# Patient Record
Sex: Female | Born: 1970 | Race: Black or African American | Hispanic: No | Smoking: Former smoker
Health system: Southern US, Community
[De-identification: ages and names within clinical notes are randomized; demographics above are authoritative.]

## PROBLEM LIST (undated history)

## (undated) ENCOUNTER — Ambulatory Visit: Discharge status: Absent without leave | Payer: BC Managed Care – PPO

## (undated) DIAGNOSIS — C73 Malignant neoplasm of thyroid gland: Secondary | ICD-10-CM

## (undated) DIAGNOSIS — J189 Pneumonia, unspecified organism: Secondary | ICD-10-CM

## (undated) DIAGNOSIS — I10 Essential (primary) hypertension: Secondary | ICD-10-CM

---

## 1992-03-10 DIAGNOSIS — C73 Malignant neoplasm of thyroid gland: Secondary | ICD-10-CM | POA: Insufficient documentation

## 1997-10-27 ENCOUNTER — Other Ambulatory Visit: Admission: RE | Admit: 1997-10-27 | Discharge: 1997-10-27 | Payer: Self-pay

## 2000-08-04 ENCOUNTER — Ambulatory Visit (HOSPITAL_COMMUNITY): Admission: RE | Admit: 2000-08-04 | Discharge: 2000-08-04 | Payer: Self-pay | Admitting: Oncology

## 2000-08-07 ENCOUNTER — Ambulatory Visit (HOSPITAL_COMMUNITY): Admission: RE | Admit: 2000-08-07 | Discharge: 2000-08-07 | Payer: Self-pay | Admitting: Oncology

## 2000-08-07 ENCOUNTER — Encounter: Payer: Self-pay | Admitting: Oncology

## 2000-08-22 ENCOUNTER — Encounter: Payer: Self-pay | Admitting: Oncology

## 2000-08-22 ENCOUNTER — Ambulatory Visit (HOSPITAL_COMMUNITY): Admission: RE | Admit: 2000-08-22 | Discharge: 2000-08-22 | Payer: Self-pay | Admitting: Oncology

## 2002-10-21 ENCOUNTER — Encounter: Payer: Self-pay | Admitting: General Surgery

## 2002-10-21 ENCOUNTER — Encounter: Admission: RE | Admit: 2002-10-21 | Discharge: 2002-10-21 | Payer: Self-pay | Admitting: General Surgery

## 2002-10-22 ENCOUNTER — Ambulatory Visit (HOSPITAL_BASED_OUTPATIENT_CLINIC_OR_DEPARTMENT_OTHER): Admission: RE | Admit: 2002-10-22 | Discharge: 2002-10-22 | Payer: Self-pay | Admitting: General Surgery

## 2013-02-17 ENCOUNTER — Other Ambulatory Visit: Payer: Self-pay | Admitting: Oncology

## 2013-02-17 DIAGNOSIS — C73 Malignant neoplasm of thyroid gland: Secondary | ICD-10-CM

## 2013-03-29 ENCOUNTER — Encounter (HOSPITAL_COMMUNITY): Payer: Self-pay

## 2013-03-30 ENCOUNTER — Encounter (HOSPITAL_COMMUNITY): Payer: Self-pay

## 2013-03-31 ENCOUNTER — Encounter (HOSPITAL_COMMUNITY): Payer: Self-pay

## 2013-04-02 ENCOUNTER — Encounter (HOSPITAL_COMMUNITY): Payer: Self-pay

## 2013-04-12 ENCOUNTER — Encounter (HOSPITAL_COMMUNITY)
Admission: RE | Admit: 2013-04-12 | Discharge: 2013-04-12 | Disposition: A | Discharge status: Home / Self Care | Payer: Medicaid Other | Source: Ambulatory Visit | Attending: Oncology | Admitting: Oncology

## 2013-04-13 ENCOUNTER — Encounter (HOSPITAL_COMMUNITY): Payer: Medicaid Other

## 2013-04-14 ENCOUNTER — Encounter (HOSPITAL_COMMUNITY): Payer: Medicaid Other

## 2013-04-16 ENCOUNTER — Encounter (HOSPITAL_COMMUNITY): Payer: Medicaid Other

## 2014-08-27 ENCOUNTER — Emergency Department (HOSPITAL_COMMUNITY)
Admission: EM | Admit: 2014-08-27 | Discharge: 2014-08-27 | Disposition: A | Discharge status: Home / Self Care | Payer: Medicaid Other | Attending: Emergency Medicine | Admitting: Emergency Medicine

## 2014-08-27 ENCOUNTER — Encounter (HOSPITAL_COMMUNITY): Payer: Self-pay | Admitting: *Deleted

## 2014-08-27 ENCOUNTER — Emergency Department (HOSPITAL_COMMUNITY): Payer: Medicaid Other

## 2014-08-27 DIAGNOSIS — J159 Unspecified bacterial pneumonia: Secondary | ICD-10-CM | POA: Insufficient documentation

## 2014-08-27 DIAGNOSIS — Z7951 Long term (current) use of inhaled steroids: Secondary | ICD-10-CM | POA: Insufficient documentation

## 2014-08-27 DIAGNOSIS — Z79899 Other long term (current) drug therapy: Secondary | ICD-10-CM | POA: Diagnosis not present

## 2014-08-27 DIAGNOSIS — J189 Pneumonia, unspecified organism: Secondary | ICD-10-CM

## 2014-08-27 DIAGNOSIS — Z720 Tobacco use: Secondary | ICD-10-CM | POA: Insufficient documentation

## 2014-08-27 DIAGNOSIS — Z85850 Personal history of malignant neoplasm of thyroid: Secondary | ICD-10-CM | POA: Insufficient documentation

## 2014-08-27 DIAGNOSIS — R05 Cough: Secondary | ICD-10-CM | POA: Diagnosis present

## 2014-08-27 HISTORY — DX: Pneumonia, unspecified organism: J18.9

## 2014-08-27 HISTORY — DX: Malignant neoplasm of thyroid gland: C73

## 2014-08-27 LAB — CBC
HCT: 39 % (ref 36.0–46.0)
Hemoglobin: 13.2 g/dL (ref 12.0–15.0)
MCH: 30.7 pg (ref 26.0–34.0)
MCHC: 33.8 g/dL (ref 30.0–36.0)
MCV: 90.7 fL (ref 78.0–100.0)
Platelets: 315 10*3/uL (ref 150–400)
RBC: 4.3 MIL/uL (ref 3.87–5.11)
RDW: 14.1 % (ref 11.5–15.5)
WBC: 19.6 10*3/uL — ABNORMAL HIGH (ref 4.0–10.5)

## 2014-08-27 LAB — BASIC METABOLIC PANEL
Anion gap: 12 (ref 5–15)
BUN: 11 mg/dL (ref 6–23)
CO2: 25 mmol/L (ref 19–32)
Calcium: 9.3 mg/dL (ref 8.4–10.5)
Chloride: 102 mmol/L (ref 96–112)
Creatinine, Ser: 0.92 mg/dL (ref 0.50–1.10)
GFR calc Af Amer: 87 mL/min — ABNORMAL LOW (ref >90)
GFR calc non Af Amer: 75 mL/min — ABNORMAL LOW (ref >90)
Glucose, Bld: 117 mg/dL — ABNORMAL HIGH (ref 70–99)
Potassium: 3.5 mmol/L (ref 3.5–5.1)
Sodium: 139 mmol/L (ref 135–145)

## 2014-08-27 LAB — I-STAT CG4 LACTIC ACID, ED: Lactic Acid, Venous: 1.56 mmol/L (ref 0.5–2.0)

## 2014-08-27 MED ORDER — DEXTROSE 5 % IV SOLN
1.0000 g | Freq: Once | INTRAVENOUS | Status: AC
  Administered 2014-08-27: 1 g via INTRAVENOUS
  Filled 2014-08-27: qty 10

## 2014-08-27 MED ORDER — AZITHROMYCIN 250 MG PO TABS
250.0000 mg | ORAL_TABLET | Freq: Every day | ORAL | Status: DC
Start: 2014-08-27 — End: 2021-07-09

## 2014-08-27 MED ORDER — AZITHROMYCIN 250 MG PO TABS
500.0000 mg | ORAL_TABLET | Freq: Once | ORAL | Status: AC
  Administered 2014-08-27: 500 mg via ORAL
  Filled 2014-08-27: qty 2

## 2014-08-27 MED ORDER — ALBUTEROL SULFATE HFA 108 (90 BASE) MCG/ACT IN AERS
2.0000 | INHALATION_SPRAY | RESPIRATORY_TRACT | Status: DC | PRN
  Administered 2014-08-27: 2 via RESPIRATORY_TRACT
  Filled 2014-08-27: qty 6.7

## 2014-08-27 MED ORDER — ACETAMINOPHEN 325 MG PO TABS
650.0000 mg | ORAL_TABLET | Freq: Once | ORAL | Status: AC
  Administered 2014-08-27: 650 mg via ORAL
  Filled 2014-08-27: qty 2

## 2014-08-27 MED ORDER — ALBUTEROL SULFATE (2.5 MG/3ML) 0.083% IN NEBU
5.0000 mg | INHALATION_SOLUTION | Freq: Once | RESPIRATORY_TRACT | Status: AC
  Administered 2014-08-27: 5 mg via RESPIRATORY_TRACT
  Filled 2014-08-27: qty 6

## 2014-08-27 MED ORDER — SODIUM CHLORIDE 0.9 % IV BOLUS (SEPSIS)
2000.0000 mL | Freq: Once | INTRAVENOUS | Status: AC
  Administered 2014-08-27: 2000 mL via INTRAVENOUS

## 2014-08-27 MED ORDER — AEROCHAMBER PLUS W/MASK MISC
1.0000 | Freq: Once | Status: AC
  Administered 2014-08-27: 1
  Filled 2014-08-27: qty 1

## 2014-08-27 NOTE — ED Notes (Signed)
Dr. Carney Bern at bedside

## 2014-08-27 NOTE — ED Notes (Signed)
Pt states that she has been diagnosed with pneumonia and bronchitis. Pt states that she is not improving over the last month. Pt reports taking her antibiotics and tylenol today. Pt reports chills. No fever at triage. Pt now reports sinus congestion and headache.

## 2014-08-27 NOTE — ED Provider Notes (Signed)
CSN: [id number]     Arrival date & time 08/27/14  1557 History   First MD Initiated Contact with Patient 08/27/14 1801     Chief Complaint  Patient presents with  . Pneumonia     (Consider location/radiation/quality/duration/timing/severity/associated sxs/prior Treatment) HPI Complete of cough productive of yellow sputum occluded by shortness of breath onset 3 weeks ago. Maximum temperature 102 3 weeks ago. She was started on Levaquin 3 weeks ago she stopped after 5 days due to feeling of throat closing and losing her vision. She subsequently was started on steroids which she completed 10 days ago and started on a steroid inhaler and loratadine 3 days ago at an urgent care center. She continues to cough. She denies nausea or vomiting. Other associated symptoms include nasal congestion and congestion in her chest Past Medical History  Diagnosis Date  . Pneumonia   . Thyroid cancer    hypertension. Thyroid cancer age 38  History reviewed. No pertinent past surgical history. No family history on file. surgical history thyroidectomy  History  Substance Use Topics  . Smoking status: Current Every Day Smoker -- 0.50 packs/day    Types: Cigarettes  . Smokeless tobacco: Not on file  . Alcohol Use: No   no illicit drug use  OB History    No data available     Review of Systems  Constitutional: Negative.   HENT: Positive for congestion.   Respiratory: Positive for cough and shortness of breath.   Cardiovascular: Negative.   Gastrointestinal: Negative.   Musculoskeletal: Negative.   Skin: Negative.   Neurological: Negative.   Psychiatric/Behavioral: Negative.   All other systems reviewed and are negative.     Allergies  Levaquin  Home Medications   Prior to Admission medications   Medication Sig Start Date End Date Taking? Authorizing Provider  diphenhydrAMINE (BENADRYL) 25 MG tablet Take 25 mg by mouth at bedtime as needed.   Yes Historical Provider, MD  fluticasone  (FLONASE) 50 MCG/ACT nasal spray Place 2 sprays into both nostrils daily.   Yes Historical Provider, MD  Fluticasone Furoate-Vilanterol 100-25 MCG/INH AEPB Inhale 1 puff into the lungs daily.   Yes Historical Provider, MD  levothyroxine (SYNTHROID, LEVOTHROID) 125 MCG tablet Take 125 mcg by mouth daily before breakfast.   Yes Historical Provider, MD  loratadine (CLARITIN) 10 MG tablet Take 10 mg by mouth daily.   Yes Historical Provider, MD  triamterene-hydrochlorothiazide (MAXZIDE-25) 37.5-25 MG per tablet Take 1 tablet by mouth daily.   Yes Historical Provider, MD  varenicline (CHANTIX PAK) 0.5 MG X 11 & 1 MG X 42 tablet Take 0.5 mg by mouth See admin instructions. Started 08/27/2014:  Take one 0.5 mg tablet by mouth once daily for 3 days, then increase to one 0.5 mg tablet twice daily for 4 days, then increase to one 1 mg tablet twice daily.   Yes Historical Provider, MD   BP 140/85 mmHg  Pulse 100  Temp(Src) 99.5 F (37.5 C) (Oral)  Resp 24  SpO2 96% Physical Exam  Constitutional: She appears well-developed and well-nourished. No distress.  HENT:  Head: Normocephalic and atraumatic.  Eyes: Conjunctivae are normal. Pupils are equal, round, and reactive to light.  Neck: Neck supple. No tracheal deviation present. No thyromegaly present.  Cardiovascular: Normal rate and regular rhythm.   No murmur heard. Pulmonary/Chest: Effort normal.  Diffuse rhonchi, coughing occasionally  Abdominal: Soft. Bowel sounds are normal. She exhibits no distension. There is no tenderness.  Musculoskeletal: Normal range of motion. She  exhibits no edema or tenderness.  Neurological: She is alert. Coordination normal.  Skin: Skin is warm and dry. No rash noted.  Psychiatric: She has a normal mood and affect.  Nursing note and vitals reviewed.   ED Course  Procedures (including critical care time) Labs Review Labs Reviewed  CBC - Abnormal; Notable for the following:    WBC 19.6 (*)    All other  components within normal limits  BASIC METABOLIC PANEL - Abnormal; Notable for the following:    Glucose, Bld 117 (*)    GFR calc non Af Amer 75 (*)    GFR calc Af Amer 87 (*)    All other components within normal limits    Imaging Review Dg Chest 2 View  08/27/2014   CLINICAL DATA:  Shortness of breath, cough, fever  EXAM: CHEST  2 VIEW  COMPARISON:  08/13/2014  FINDINGS: Mild patchy left lower lobe opacity, suspicious for pneumonia. No pleural effusion or pneumothorax.  The heart is normal in size.  Visualized osseous structures are within normal limits.  IMPRESSION: Patchy left lower lobe opacity, suspicious for pneumonia.   Electronically Signed   By: Julian Hy M.D.   On: 08/27/2014 17:26     EKG Interpretation None     Chest x-ray viewed by me  8:50 PM patient feels much improved after treatment with albuterol nebulized treatment, intravenous fluids and antibiotics.  Results for orders placed or performed during the hospital encounter of 08/27/14  CBC  Result Value Ref Range   WBC 19.6 (H) 4.0 - 10.5 K/uL   RBC 4.30 3.87 - 5.11 MIL/uL   Hemoglobin 13.2 12.0 - 15.0 g/dL   HCT 39.0 36.0 - 46.0 %   MCV 90.7 78.0 - 100.0 fL   MCH 30.7 26.0 - 34.0 pg   MCHC 33.8 30.0 - 36.0 g/dL   RDW 14.1 11.5 - 15.5 %   Platelets 315 150 - 400 K/uL  Basic metabolic panel  Result Value Ref Range   Sodium 139 135 - 145 mmol/L   Potassium 3.5 3.5 - 5.1 mmol/L   Chloride 102 96 - 112 mmol/L   CO2 25 19 - 32 mmol/L   Glucose, Bld 117 (H) 70 - 99 mg/dL   BUN 11 6 - 23 mg/dL   Creatinine, Ser 0.92 0.50 - 1.10 mg/dL   Calcium 9.3 8.4 - 10.5 mg/dL   GFR calc non Af Amer 75 (L) >90 mL/min   GFR calc Af Amer 87 (L) >90 mL/min   Anion gap 12 5 - 15  I-Stat CG4 Lactic Acid, ED  Result Value Ref Range   Lactic Acid, Venous 1.56 0.5 - 2.0 mmol/L   Dg Chest 2 View  08/27/2014   CLINICAL DATA:  Shortness of breath, cough, fever  EXAM: CHEST  2 VIEW  COMPARISON:  08/13/2014  FINDINGS: Mild  patchy left lower lobe opacity, suspicious for pneumonia. No pleural effusion or pneumothorax.  The heart is normal in size.  Visualized osseous structures are within normal limits.  IMPRESSION: Patchy left lower lobe opacity, suspicious for pneumonia.   Electronically Signed   By: Julian Hy M.D.   On: 08/27/2014 17:26    MDM  PSI/port score 34 without history of neoplastic disease. She is cancer free since age 33. Considering history of thyroid cancer PSI port score equals 64. Outpatient treatment reasonable. Patient feels and looks well on discharge. Plan discontinueBReo inhaler. Albuterol HFA with spacer to go. Prescription Zithromax. Follow-up with primary  care physician at [facility] family [facility] if not improving by next week. Diagnosis community acquired pneumonia Final diagnoses:  None        Orlie Dakin, MD 08/27/14 2103

## 2014-08-27 NOTE — ED Notes (Signed)
Pt ambulated to restroom. 

## 2014-08-27 NOTE — Discharge Instructions (Signed)
Pneumonia Use your inhaler 2 puffs every 4 hours as needed for cough or shortness of breath. Discontinue BREO inhaler. Call your primary care physician at [facility] if not improving by next week. Return if your condition worsens for any reason. Pneumonia is an infection of the lungs.  CAUSES Pneumonia may be caused by bacteria or a virus. Usually, these infections are caused by breathing infectious particles into the lungs (respiratory tract). SIGNS AND SYMPTOMS   Cough.  Fever.  Chest pain.  Increased rate of breathing.  Wheezing.  Mucus production. DIAGNOSIS  If you have the common symptoms of pneumonia, your health care provider will typically confirm the diagnosis with a chest X-ray. The X-ray will show an abnormality in the lung (pulmonary infiltrate) if you have pneumonia. Other tests of your blood, urine, or sputum may be done to find the specific cause of your pneumonia. Your health care provider may also do tests (blood gases or pulse oximetry) to see how well your lungs are working. TREATMENT  Some forms of pneumonia may be spread to other people when you cough or sneeze. You may be asked to wear a mask before and during your exam. Pneumonia that is caused by bacteria is treated with antibiotic medicine. Pneumonia that is caused by the influenza virus may be treated with an antiviral medicine. Most other viral infections must run their course. These infections will not respond to antibiotics.  HOME CARE INSTRUCTIONS   Cough suppressants may be used if you are losing too much rest. However, coughing protects you by clearing your lungs. You should avoid using cough suppressants if you can.  Your health care provider may have prescribed medicine if he or she thinks your pneumonia is caused by bacteria or influenza. Finish your medicine even if you start to feel better.  Your health care provider may also prescribe an expectorant. This loosens the mucus to be coughed  up.  Take medicines only as directed by your health care provider.  Do not smoke. Smoking is a common cause of bronchitis and can contribute to pneumonia. If you are a smoker and continue to smoke, your cough may last several weeks after your pneumonia has cleared.  A cold steam vaporizer or humidifier in your room or home may help loosen mucus.  Coughing is often worse at night. Sleeping in a semi-upright position in a recliner or using a couple pillows under your head will help with this.  Get rest as you feel it is needed. Your body will usually let you know when you need to rest. PREVENTION A pneumococcal shot (vaccine) is available to prevent a common bacterial cause of pneumonia. This is usually suggested for:  People over 29 years old.  Patients on chemotherapy.  People with chronic lung problems, such as bronchitis or emphysema.  People with immune system problems. If you are over 65 or have a high risk condition, you may receive the pneumococcal vaccine if you have not received it before. In some countries, a routine influenza vaccine is also recommended. This vaccine can help prevent some cases of pneumonia.You may be offered the influenza vaccine as part of your care. If you smoke, it is time to quit. You may receive instructions on how to stop smoking. Your health care provider can provide medicines and counseling to help you quit. SEEK MEDICAL CARE IF: You have a fever. SEEK IMMEDIATE MEDICAL CARE IF:   Your illness becomes worse. This is especially true if you are  elderly or weakened from any other disease.  You cannot control your cough with suppressants and are losing sleep.  You begin coughing up blood.  You develop pain which is getting worse or is uncontrolled with medicines.  Any of the symptoms which initially brought you in for treatment are getting worse rather than better.  You develop shortness of breath or chest pain. MAKE SURE YOU:   Understand  these instructions.  Will watch your condition.  Will get help right away if you are not doing well or get worse. Document Released: 04/15/2005 Document Revised: 08/30/2013 Document Reviewed: 07/05/2010 [facility] Patient Information 2015 [place], [place]. This information is not intended to replace advice given to you by your health care provider. Make sure you discuss any questions you have with your health care provider.

## 2014-10-29 ENCOUNTER — Emergency Department (HOSPITAL_COMMUNITY)
Admission: EM | Admit: 2014-10-29 | Discharge: 2014-10-29 | Disposition: A | Discharge status: Home / Self Care | Payer: Medicaid Other | Attending: Emergency Medicine | Admitting: Emergency Medicine

## 2014-10-29 ENCOUNTER — Encounter (HOSPITAL_COMMUNITY): Payer: Self-pay | Admitting: *Deleted

## 2014-10-29 DIAGNOSIS — Z792 Long term (current) use of antibiotics: Secondary | ICD-10-CM | POA: Insufficient documentation

## 2014-10-29 DIAGNOSIS — R531 Weakness: Secondary | ICD-10-CM | POA: Insufficient documentation

## 2014-10-29 DIAGNOSIS — Z3202 Encounter for pregnancy test, result negative: Secondary | ICD-10-CM | POA: Diagnosis not present

## 2014-10-29 DIAGNOSIS — Z8701 Personal history of pneumonia (recurrent): Secondary | ICD-10-CM | POA: Diagnosis not present

## 2014-10-29 DIAGNOSIS — R5383 Other fatigue: Secondary | ICD-10-CM | POA: Diagnosis not present

## 2014-10-29 DIAGNOSIS — R609 Edema, unspecified: Secondary | ICD-10-CM

## 2014-10-29 DIAGNOSIS — Z87891 Personal history of nicotine dependence: Secondary | ICD-10-CM | POA: Insufficient documentation

## 2014-10-29 DIAGNOSIS — R2243 Localized swelling, mass and lump, lower limb, bilateral: Secondary | ICD-10-CM | POA: Diagnosis present

## 2014-10-29 DIAGNOSIS — Z79899 Other long term (current) drug therapy: Secondary | ICD-10-CM | POA: Insufficient documentation

## 2014-10-29 DIAGNOSIS — I10 Essential (primary) hypertension: Secondary | ICD-10-CM | POA: Insufficient documentation

## 2014-10-29 DIAGNOSIS — Z85850 Personal history of malignant neoplasm of thyroid: Secondary | ICD-10-CM | POA: Diagnosis not present

## 2014-10-29 HISTORY — DX: Essential (primary) hypertension: I10

## 2014-10-29 LAB — COMPREHENSIVE METABOLIC PANEL
ALK PHOS: 63 U/L (ref 38–126)
ALT: 24 U/L (ref 14–54)
AST: 21 U/L (ref 15–41)
Albumin: 3.5 g/dL (ref 3.5–5.0)
Anion gap: 6 (ref 5–15)
BUN: 12 mg/dL (ref 6–20)
CO2: 30 mmol/L (ref 22–32)
Calcium: 9.4 mg/dL (ref 8.9–10.3)
Chloride: 105 mmol/L (ref 101–111)
Creatinine, Ser: 0.81 mg/dL (ref 0.44–1.00)
GFR calc Af Amer: >60 mL/min (ref >60)
GFR calc non Af Amer: >60 mL/min (ref >60)
Glucose, Bld: 104 mg/dL — ABNORMAL HIGH (ref 65–99)
POTASSIUM: 3.6 mmol/L (ref 3.5–5.1)
SODIUM: 141 mmol/L (ref 135–145)
Total Bilirubin: 0.5 mg/dL (ref 0.3–1.2)
Total Protein: 6.1 g/dL — ABNORMAL LOW (ref 6.5–8.1)

## 2014-10-29 LAB — URINALYSIS, ROUTINE W REFLEX MICROSCOPIC
Bilirubin Urine: NEGATIVE
Glucose, UA: NEGATIVE mg/dL
HGB URINE DIPSTICK: NEGATIVE
Ketones, ur: NEGATIVE mg/dL
LEUKOCYTES UA: NEGATIVE
Nitrite: NEGATIVE
PROTEIN: NEGATIVE mg/dL
SPECIFIC GRAVITY, URINE: 1.026 (ref 1.005–1.030)
Urobilinogen, UA: 1 mg/dL (ref 0.0–1.0)
pH: 5.5 (ref 5.0–8.0)

## 2014-10-29 LAB — CBC WITH DIFFERENTIAL/PLATELET
BASOS ABS: 0 10*3/uL (ref 0.0–0.1)
Basophils Relative: 1 % (ref 0–1)
Eosinophils Absolute: 0.6 10*3/uL (ref 0.0–0.7)
Eosinophils Relative: 9 % — ABNORMAL HIGH (ref 0–5)
HCT: 37.5 % (ref 36.0–46.0)
HEMOGLOBIN: 12.9 g/dL (ref 12.0–15.0)
LYMPHS ABS: 2.8 10*3/uL (ref 0.7–4.0)
Lymphocytes Relative: 39 % (ref 12–46)
MCH: 30.3 pg (ref 26.0–34.0)
MCHC: 34.4 g/dL (ref 30.0–36.0)
MCV: 88 fL (ref 78.0–100.0)
MONOS PCT: 9 % (ref 3–12)
Monocytes Absolute: 0.7 10*3/uL (ref 0.1–1.0)
Neutro Abs: 3.2 10*3/uL (ref 1.7–7.7)
Neutrophils Relative %: 44 % (ref 43–77)
PLATELETS: 268 10*3/uL (ref 150–400)
RBC: 4.26 MIL/uL (ref 3.87–5.11)
RDW: 13.2 % (ref 11.5–15.5)
WBC: 7.4 10*3/uL (ref 4.0–10.5)

## 2014-10-29 LAB — BRAIN NATRIURETIC PEPTIDE: B Natriuretic Peptide: 38.2 pg/mL (ref 0.0–100.0)

## 2014-10-29 LAB — POC URINE PREG, ED: PREG TEST UR: NEGATIVE

## 2014-10-29 NOTE — Discharge Instructions (Signed)
Your blood work and urine analysis is normal today. Keep legs elevated. Low salt diet. Exercise for at least 30 min 5 times a day. Try TED hose. Follow up with primary care doctor.    Peripheral Edema You have swelling in your legs (peripheral edema). This swelling is due to excess accumulation of salt and water in your body. Edema may be a sign of heart, kidney or liver disease, or a side effect of a medication. It may also be due to problems in the leg veins. Elevating your legs and using special support stockings may be very helpful, if the cause of the swelling is due to poor venous circulation. Avoid long periods of standing, whatever the cause. Treatment of edema depends on identifying the cause. Chips, pretzels, pickles and other salty foods should be avoided. Restricting salt in your diet is almost always needed. Water pills (diuretics) are often used to remove the excess salt and water from your body via urine. These medicines prevent the kidney from reabsorbing sodium. This increases urine flow. Diuretic treatment may also result in lowering of potassium levels in your body. Potassium supplements may be needed if you have to use diuretics daily. Daily weights can help you keep track of your progress in clearing your edema. You should call your caregiver for follow up care as recommended. SEEK IMMEDIATE MEDICAL CARE IF:   You have increased swelling, pain, redness, or heat in your legs.  You develop shortness of breath, especially when lying down.  You develop chest or abdominal pain, weakness, or fainting.  You have a fever. Document Released: 05/23/2004 Document Revised: 07/08/2011 Document Reviewed: 05/03/2009 [facility] Patient Information 2015 [place], [place]. This information is not intended to replace advice given to you by your health care provider. Make sure you discuss any questions you have with your health care provider.

## 2014-10-29 NOTE — ED Provider Notes (Signed)
CSN: [id number]     Arrival date & time 10/29/14  1121 History   First MD Initiated Contact with Patient 10/29/14 1241     Chief Complaint  Patient presents with  . Leg Swelling     (Consider location/radiation/quality/duration/timing/severity/associated sxs/prior Treatment) HPI Cathy Ramirez is a 44 y.o. female with history of thyroid cancer, pneumonia, hypertension, presents to emergency department with leg swelling, fatigue, generalized pain, paresthesias to bilateral fingertips. Patient states her symptoms started approximately 2 months ago after she was treated for pneumonia. She states symptoms gradually worsening. She was supposed to follow-up with primary care doctor but she states she did not go. She has not seen her doctor for this. She states she takes blood pressure medication, Maxzide, states she has been on it for years. She states "I think my body is just stripping potassium." She states "I think it's the circulation issues and I think I need to see a specialist." She admits that her diet is not very good, and states she is going to fix that. She she has generalized body aches. States pain is worsened after she gets up out of a chair or in the morning. She states she looked up symptoms of fibromyalgia and things that she might have it. She has no other complaints.  Past Medical History  Diagnosis Date  . Pneumonia   . Thyroid cancer   . Hypertension    History reviewed. No pertinent past surgical history. No family history on file. History  Substance Use Topics  . Smoking status: Former Smoker -- 0.50 packs/day    Types: Cigarettes    Quit date: 09/01/2014  . Smokeless tobacco: Not on file  . Alcohol Use: No   OB History    No data available     Review of Systems  Constitutional: Positive for fatigue. Negative for fever and chills.  Respiratory: Negative for cough, chest tightness and shortness of breath.   Cardiovascular: Positive for leg swelling. Negative for  chest pain and palpitations.  Gastrointestinal: Negative for nausea, vomiting, abdominal pain and diarrhea.  Genitourinary: Negative for dysuria, flank pain and pelvic pain.  Musculoskeletal: Positive for myalgias and arthralgias. Negative for neck pain and neck stiffness.  Skin: Negative for rash.  Neurological: Positive for weakness. Negative for dizziness and headaches.  All other systems reviewed and are negative.     Allergies  Levaquin  Home Medications   Prior to Admission medications   Medication Sig Start Date End Date Taking? Authorizing Provider  azithromycin (ZITHROMAX) 250 MG tablet Take 1 tablet (250 mg total) by mouth daily. 1 tablet by mouth daily starting 08/28/2014 08/27/14   Orlie Dakin, MD  diphenhydrAMINE (BENADRYL) 25 MG tablet Take 25 mg by mouth at bedtime as needed.    Historical Provider, MD  fluticasone (FLONASE) 50 MCG/ACT nasal spray Place 2 sprays into both nostrils daily.    Historical Provider, MD  Fluticasone Furoate-Vilanterol 100-25 MCG/INH AEPB Inhale 1 puff into the lungs daily.    Historical Provider, MD  levothyroxine (SYNTHROID, LEVOTHROID) 125 MCG tablet Take 125 mcg by mouth daily before breakfast.    Historical Provider, MD  loratadine (CLARITIN) 10 MG tablet Take 10 mg by mouth daily.    Historical Provider, MD  triamterene-hydrochlorothiazide (MAXZIDE-25) 37.5-25 MG per tablet Take 1 tablet by mouth daily.    Historical Provider, MD  varenicline (CHANTIX PAK) 0.5 MG X 11 & 1 MG X 42 tablet Take 0.5 mg by mouth See admin instructions. Started 08/27/2014:  Take one 0.5 mg tablet by mouth once daily for 3 days, then increase to one 0.5 mg tablet twice daily for 4 days, then increase to one 1 mg tablet twice daily.    Historical Provider, MD   BP 122/79 mmHg  Pulse 72  Temp(Src) 98.3 F (36.8 C) (Oral)  Resp 16  Ht 5\' 6"  (1.676 m)  Wt 175 lb (79.379 kg)  BMI 28.26 kg/m2  SpO2 100% Physical Exam  Constitutional: She is oriented to person,  place, and time. She appears well-developed and well-nourished. No distress.  HENT:  Head: Normocephalic.  Eyes: Conjunctivae are normal.  Neck: Neck supple.  Cardiovascular: Normal rate, regular rhythm and normal heart sounds.   Pulmonary/Chest: Effort normal and breath sounds normal. No respiratory distress. She has no wheezes. She has no rales.  Abdominal: Soft. Bowel sounds are normal. She exhibits no distension. There is no tenderness. There is no rebound.  Musculoskeletal: She exhibits no edema.  2+ left ankle edema, pitting. Trace edema right ankle. No obvious swelling to the bilateral shins, knees, thighs. Feet appear to be normal as well. Dorsal pedal pulses intact bilaterally. Normal hand exams, range of motion of all fingers of both hands, decreased sensation to the palmar surface of the right hand middle finger tip. Otherwise sensation is intact.  Neurological: She is alert and oriented to person, place, and time.  Skin: Skin is warm and dry.  Psychiatric: She has a normal mood and affect. Her behavior is normal.  Nursing note and vitals reviewed.   ED Course  Procedures (including critical care time) Labs Review Labs Reviewed  CBC WITH DIFFERENTIAL/PLATELET - Abnormal; Notable for the following:    Eosinophils Relative 9 (*)    All other components within normal limits  COMPREHENSIVE METABOLIC PANEL - Abnormal; Notable for the following:    Glucose, Bld 104 (*)    Total Protein 6.1 (*)    All other components within normal limits  BRAIN NATRIURETIC PEPTIDE  URINALYSIS, ROUTINE W REFLEX MICROSCOPIC (NOT AT [facility])  POC URINE PREG, ED    Imaging Review No results found.   EKG Interpretation None      MDM   Final diagnoses:  Dependent edema     patient with bilateral leg swelling, numbness and tingling in fingertips, weight gain. History of thyroid cancer, states her PCP checked her thyroid that was normal. Patient is mainly concerned about the swelling in her  legs. She admits to poor diet. Discussed fixing her diet and starting to exercise. Will get labs a urinalysis to rule out a slight abnormalities, kidney function. We'll get BNP to rule out CHF. Patient does not have orthopnea, no chest pain or shortness of breath.  3:39 PM Patient's labs are unremarkable. Urinalysis is normal. Swelling is bilateral, only over the ankles. Highly doubt DVT. She's got normal pulses in both extremities. Most likely dependent edema. Instructed to try TED hose, elevate legs, face her diet. Follow-up with primary care doctor. Normal vital signs at this time. Stable for discharge home.  Filed Vitals:   10/29/14 1315 10/29/14 1330 10/29/14 1345 10/29/14 1523  BP: 118/75 129/90 135/66   Pulse: 79 72 64   Temp:    98 F (36.7 C)  TempSrc:      Resp:      Height:      Weight:      SpO2: 99% 100% 100%      Jeannett Senior, PA-C 10/29/14 [place], MD 10/29/14 1635

## 2014-10-29 NOTE — ED Notes (Signed)
Pt reports ankles and bilat hand swelling x approx two weeks.  Feet have progressed to pain with ambulation.  Arms initially have been n/t but has now has transferred to bilat fingers.  Right hand middle finger tip reported to remain numb and tingling.  Left ankle swelling > right.  Pt admits to unknowingly probably taking in more salt than she should. Has had thyroid checked which came out ok.  States "i know there is something there that hasn't been found"

## 2014-10-29 NOTE — ED Notes (Addendum)
Pt c/o to entire body.  Stated began several weeks ago in both lower extremities.  Now arms are also swollen.  C/o lower back pain and body aches and decreased urinary output.  Pt states weight taken in ED is 10 lbs greater than her weight 1 month prior.

## 2016-11-16 IMAGING — CR DG CHEST 2V
2 series · 2 of 2 positions shown · non-contrast
Comparison: 08/13/2014

CLINICAL DATA: Shortness of breath, cough, fever

EXAM:
CHEST  2 VIEW

[w chest pa]
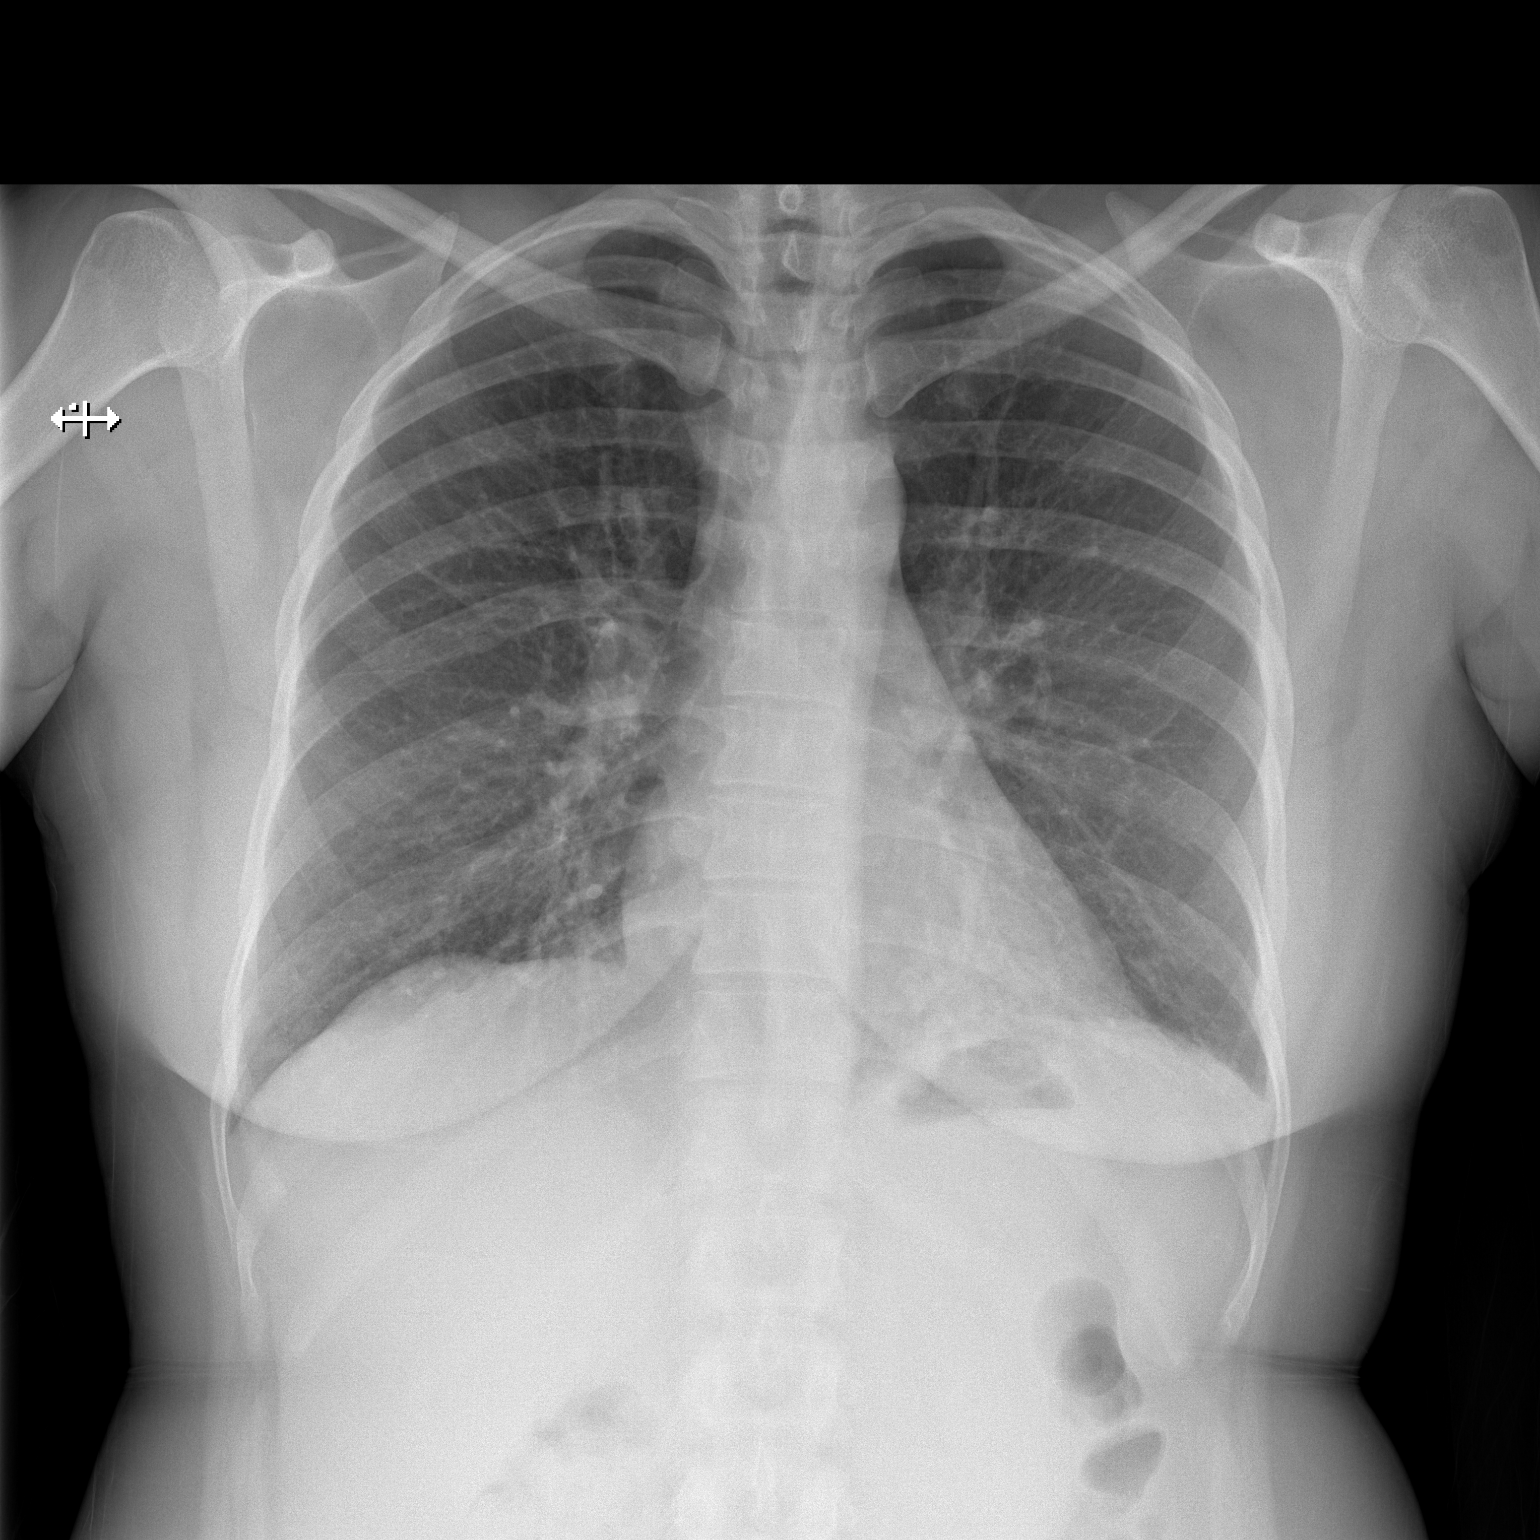

[w chest lat]
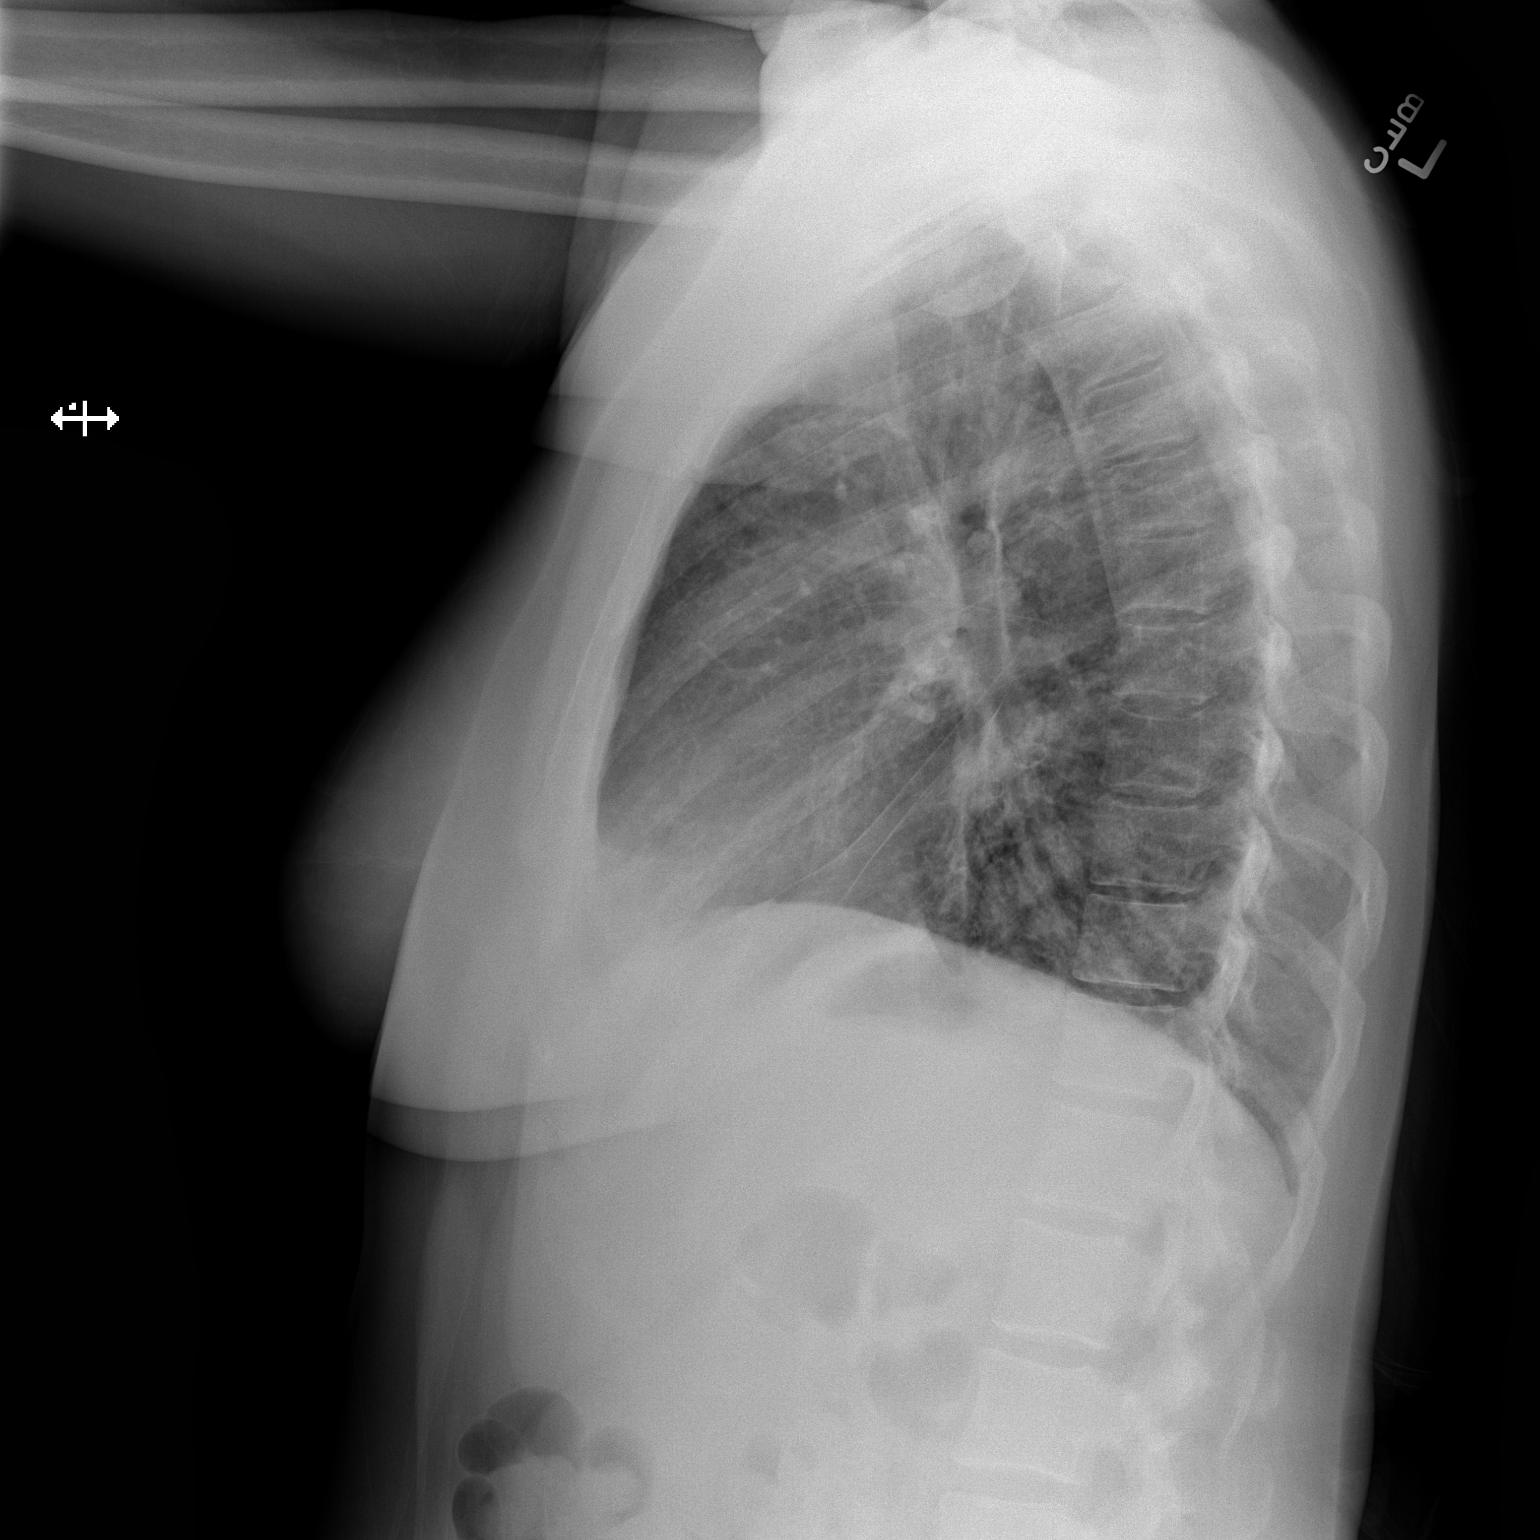

[2 of 2 positions shown; findings below may reference images not displayed]

FINDINGS: Mild patchy left lower lobe opacity, suspicious for pneumonia. No
pleural effusion or pneumothorax.

The heart is normal in size.

Visualized osseous structures are within normal limits.
IMPRESSION: Patchy left lower lobe opacity, suspicious for pneumonia.

## 2021-06-18 DIAGNOSIS — E890 Postprocedural hypothyroidism: Secondary | ICD-10-CM | POA: Diagnosis not present

## 2021-06-18 DIAGNOSIS — Z6829 Body mass index (BMI) 29.0-29.9, adult: Secondary | ICD-10-CM | POA: Diagnosis not present

## 2021-06-18 DIAGNOSIS — Z0000 Encounter for general adult medical examination without abnormal findings: Secondary | ICD-10-CM | POA: Diagnosis not present

## 2021-06-18 DIAGNOSIS — Z13220 Encounter for screening for lipoid disorders: Secondary | ICD-10-CM | POA: Diagnosis not present

## 2021-06-18 DIAGNOSIS — Z124 Encounter for screening for malignant neoplasm of cervix: Secondary | ICD-10-CM | POA: Diagnosis not present

## 2021-06-19 DIAGNOSIS — E890 Postprocedural hypothyroidism: Secondary | ICD-10-CM | POA: Diagnosis not present

## 2021-07-06 NOTE — Progress Notes (Incomplete)
[facility]  [address] [place],  [place]  [address] [phone]  Clinic Day:  07/06/2021  Referring physician: [facility] [place]  This document serves as a record of services personally performed by Hosie Poisson, MD. It was created on their behalf by Curry,Lauren E, a trained medical scribe. The creation of this record is based on the scribe's personal observations and the provider's statements to them.  ASSESSMENT & PLAN:   History of papillary adenocarcinoma of the thyroid, diagnosed in 1993. Treated with thyroidectomy and radioactive iodine. She had recurrence in April 2002 and received radioactive iodine again.   Hypothyroidism, for which she is on levothyroxine 125 mcg daily.   This is a pleasant 51 year old female, who I have seen previously, with a history of thyroid cancer. She has come to re-establish care.   Thank you for the opportunity to participate in the care of your patients  I provided *** minutes of face-to-face time during this this encounter and > 50% was spent counseling as documented under my assessment and plan.    Derwood Kaplan, MD [facility] [address] [place] [address] Dept: [phone] Dept Fax: [phone]    CHIEF COMPLAINT:  CC: History of papillary carcinoma of the thyroid  Current Treatment:  Surveillance   HISTORY OF PRESENT ILLNESS:  Cathy Ramirez is a 52 y.o. female who I have seen previously, with a history of papillary adenocarcinoma of the thyroid. She originally has a papillary carcinoma of the thyroid, diagnosed in 1993, and treated with a total thyroidectomy with positive nodes. She received I-131 radioactive iodine in 1994 and has been hypothyroid since that time. I had evaluated her in April of 2022 and sent her for a thyroid uptake scan, which was positive in the neck area, and so the patient was  treated once again with radioactive iodine. She was then lost to follow up. CT neck from July 2015 was negative. Upon review of her records, I cannot find any updated imaging.   INTERVAL HISTORY:  I have reviewed her chart and materials related to her cancer extensively and collaborated history with the patient. Summary of oncologic history is as follows: Oncology History   No history exists.    Daleen Snook ***.   Her  appetite is good, and she is eating well. She denies any significant unintentional weight loss or gain.  She denies fever, chills or other signs of infection.  She denies nausea, vomiting, bowel issues, or abdominal pain.  She denies sore throat, cough, dyspnea, or chest pain.  HISTORY:   Past Medical History:  Diagnosis Date   Hypertension    Pneumonia    Thyroid cancer ([place])     No past surgical history on file.  No family history on file.  Social History:  reports that she quit smoking about 6 years ago. Her smoking use included cigarettes. She smoked an average of .5 packs per day. She does not have any smokeless tobacco history on file. She reports that she does not drink alcohol and does not use drugs.The patient is {Blank single:19197::"alone","accompanied by"} *** today. She is divorced and lives at home ***. She has 3 children, 2 daughters and 1 son, who are all in the [employer]. She works as a ***, and has never been exposed to chemicals or other toxic agents.  Allergies:  Allergies  Allergen Reactions   Levaquin [Levofloxacin] Swelling and  Other (See Comments)    Throat and tongue swelling, blurry vision    Current Medications: Current Outpatient Medications  Medication Sig Dispense Refill   azithromycin (ZITHROMAX) 250 MG tablet Take 1 tablet (250 mg total) by mouth daily. 1 tablet by mouth daily starting 08/28/2014 4 tablet 0   diphenhydrAMINE (BENADRYL) 25 MG tablet Take 25 mg by mouth at bedtime as needed.     fluticasone (FLONASE) 50 MCG/ACT nasal spray  Place 2 sprays into both nostrils daily.     Fluticasone Furoate-Vilanterol 100-25 MCG/INH AEPB Inhale 1 puff into the lungs daily.     levothyroxine (SYNTHROID, LEVOTHROID) 125 MCG tablet Take 125 mcg by mouth daily before breakfast.     loratadine (CLARITIN) 10 MG tablet Take 10 mg by mouth daily.     triamterene-hydrochlorothiazide (MAXZIDE-25) 37.5-25 MG per tablet Take 1 tablet by mouth daily.     varenicline (CHANTIX PAK) 0.5 MG X 11 & 1 MG X 42 tablet Take 0.5 mg by mouth See admin instructions. Started 08/27/2014:  Take one 0.5 mg tablet by mouth once daily for 3 days, then increase to one 0.5 mg tablet twice daily for 4 days, then increase to one 1 mg tablet twice daily.     No current facility-administered medications for this visit.    REVIEW OF SYSTEMS:  Review of Systems - Oncology    VITALS:  There were no vitals taken for this visit.  Wt Readings from Last 3 Encounters:  10/29/14 175 lb (79.4 kg)    There is no height or weight on file to calculate BMI.  Performance status (ECOG): {CHL ONC [id number]  PHYSICAL EXAM:  Physical Exam   LABS:   CBC Latest Ref Rng & Units 10/29/2014 08/27/2014  WBC 4.0 - 10.5 K/uL 7.4 19.6(H)  Hemoglobin 12.0 - 15.0 g/dL 12.9 13.2  Hematocrit 36.0 - 46.0 % 37.5 39.0  Platelets 150 - 400 K/uL 268 315   CMP Latest Ref Rng & Units 10/29/2014 08/27/2014  Glucose 65 - 99 mg/dL 104(H) 117(H)  BUN 6 - 20 mg/dL 12 11  Creatinine 0.44 - 1.00 mg/dL 0.81 0.92  Sodium 135 - 145 mmol/L 141 139  Potassium 3.5 - 5.1 mmol/L 3.6 3.5  Chloride 101 - 111 mmol/L 105 102  CO2 22 - 32 mmol/L 30 25  Calcium 8.9 - 10.3 mg/dL 9.4 9.3  Total Protein 6.5 - 8.1 g/dL 6.1(L) -  Total Bilirubin 0.3 - 1.2 mg/dL 0.5 -  Alkaline Phos 38 - 126 U/L 63 -  AST 15 - 41 U/L 21 -  ALT 14 - 54 U/L 24 -     No results found for: CEA1 / No results found for: CEA1 No results found for: PSA1 No results found for: STM196 No results found for: CAN125  No results found  for: TOTALPROTELP, ALBUMINELP, A1GS, A2GS, BETS, BETA2SER, GAMS, MSPIKE, SPEI No results found for: TIBC, FERRITIN, IRONPCTSAT No results found for: LDH  STUDIES:  No results found.     I, Rita Ohara, am acting as scribe for Derwood Kaplan, MD  I have reviewed this report as typed by the medical scribe, and it is complete and accurate.

## 2021-07-08 ENCOUNTER — Other Ambulatory Visit: Payer: Self-pay | Admitting: Oncology

## 2021-07-08 DIAGNOSIS — C73 Malignant neoplasm of thyroid gland: Secondary | ICD-10-CM

## 2021-07-09 ENCOUNTER — Inpatient Hospital Stay: Payer: BC Managed Care – PPO | Attending: Oncology | Admitting: Oncology

## 2021-07-09 ENCOUNTER — Other Ambulatory Visit: Payer: Self-pay

## 2021-07-09 ENCOUNTER — Encounter: Payer: Self-pay | Admitting: Oncology

## 2021-07-09 ENCOUNTER — Inpatient Hospital Stay: Payer: BC Managed Care – PPO

## 2021-07-09 VITALS — BP 125/74 | HR 94 | Temp 98.3°F | Resp 18 | Ht 66.0 in | Wt 175.9 lb

## 2021-07-09 DIAGNOSIS — Z87891 Personal history of nicotine dependence: Secondary | ICD-10-CM | POA: Diagnosis not present

## 2021-07-09 DIAGNOSIS — C73 Malignant neoplasm of thyroid gland: Secondary | ICD-10-CM

## 2021-07-09 DIAGNOSIS — Z85850 Personal history of malignant neoplasm of thyroid: Secondary | ICD-10-CM | POA: Insufficient documentation

## 2021-07-09 DIAGNOSIS — Z923 Personal history of irradiation: Secondary | ICD-10-CM | POA: Diagnosis not present

## 2021-07-09 DIAGNOSIS — E890 Postprocedural hypothyroidism: Secondary | ICD-10-CM | POA: Diagnosis not present

## 2021-07-09 DIAGNOSIS — Z79899 Other long term (current) drug therapy: Secondary | ICD-10-CM | POA: Diagnosis not present

## 2021-07-09 DIAGNOSIS — E039 Hypothyroidism, unspecified: Secondary | ICD-10-CM | POA: Insufficient documentation

## 2021-07-09 LAB — TSH: TSH: 0.729 u[IU]/mL (ref 0.350–4.500)

## 2021-07-10 LAB — THYROGLOBULIN ANTIBODY: Thyroglobulin Antibody: <1 IU/mL (ref 0.0–0.9)

## 2021-07-12 ENCOUNTER — Telehealth: Payer: Self-pay

## 2021-07-12 NOTE — Telephone Encounter (Signed)
Spoke with Cathy Ramirez information about test normal given. ?

## 2021-07-12 NOTE — Telephone Encounter (Signed)
-----   Message from Derwood Kaplan, MD sent at 07/11/2021  8:00 PM EDT ----- ?Regarding: call ?Tell her the rest of the tests are normal ? ?

## 2021-07-18 LAB — THYROGLOBULIN LEVEL: Thyroglobulin: <2 ng/mL
